# Patient Record
Sex: Male | Born: 1995 | Race: White | Hispanic: No | Marital: Single | Smoking: Never smoker
Health system: Southern US, Community
[De-identification: ages and names within clinical notes are randomized; demographics above are authoritative.]

## PROBLEM LIST (undated history)

## (undated) DIAGNOSIS — F988 Other specified behavioral and emotional disorders with onset usually occurring in childhood and adolescence: Secondary | ICD-10-CM

## (undated) HISTORY — PX: OTHER SURGICAL HISTORY: SHX169

## (undated) HISTORY — DX: Other specified behavioral and emotional disorders with onset usually occurring in childhood and adolescence: F98.8

---
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
## 2015-02-02 ENCOUNTER — Encounter: Payer: Self-pay | Admitting: Family Medicine

## 2015-02-02 ENCOUNTER — Ambulatory Visit (INDEPENDENT_AMBULATORY_CARE_PROVIDER_SITE_OTHER): Payer: 59 | Admitting: Family Medicine

## 2015-02-02 VITALS — BP 122/70 | Temp 97.6°F | Ht 71.0 in | Wt 294.0 lb

## 2015-02-02 DIAGNOSIS — B079 Viral wart, unspecified: Secondary | ICD-10-CM

## 2015-02-02 MED ORDER — SALICYLIC ACID 17 % EX SOLN: Freq: Every day | CUTANEOUS | Status: AC

## 2015-02-02 NOTE — Progress Notes (Signed)
   Subjective:    Patient ID: Gerald Roman, male    DOB: 1996/09/15, 19 y.o.   MRN: [id number]  HPIGrowth on leg leg. Came up about 3 months ago. Painful to the touch. Has tried Dr. Margart SicklesScholl's topical without success. No warts elsewhere  Working at a [employer], learning tht business  Overall healthy, added some weight  Just e cigarettes  Day numb [phone] Home  Review of Systems No rash no headache no chest pain no shortness of breath    Objective:   Physical Exam  Alert vital signs stable obesity present. Lungs clear heart regular rate and rhythm left anterior knee probable wart      Assessment & Plan:  Impression wart discussed #2 obesity discussed plan occlusal daily at bedtime. Dermatology referral. Maceo ProWSL

## 2015-02-25 ENCOUNTER — Encounter: Payer: Self-pay | Admitting: Family Medicine

## 2017-01-31 ENCOUNTER — Ambulatory Visit (INDEPENDENT_AMBULATORY_CARE_PROVIDER_SITE_OTHER): Payer: BLUE CROSS/BLUE SHIELD | Admitting: Family Medicine

## 2017-01-31 ENCOUNTER — Encounter: Payer: Self-pay | Admitting: Family Medicine

## 2017-01-31 VITALS — BP 136/84 | Temp 99.2°F | Ht 71.0 in | Wt 279.8 lb

## 2017-01-31 DIAGNOSIS — J329 Chronic sinusitis, unspecified: Secondary | ICD-10-CM | POA: Diagnosis not present

## 2017-01-31 MED ORDER — BENZONATATE 100 MG PO CAPS
100.0000 mg | ORAL_CAPSULE | Freq: Four times a day (QID) | ORAL | 0 refills | Status: AC | PRN
Start: 2017-01-31 — End: 2018-01-31

## 2017-01-31 MED ORDER — AMOXICILLIN-POT CLAVULANATE 875-125 MG PO TABS: 1.0000 | ORAL_TABLET | Freq: Two times a day (BID) | ORAL | 0 refills | Status: AC

## 2017-01-31 NOTE — Progress Notes (Signed)
   Subjective:    Patient ID: Gerald Roman, male    DOB: 09-15-1996, 21 y.o.   MRN: [id number]  Cough  This is a new problem. The current episode started in the past 7 days. Associated symptoms include a fever, headaches and nasal congestion. Associated symptoms comments: Vomiting and diarrhea. He has tried OTC cough suppressant for the symptoms.   Pretty bad headache. Muscle and joint ahes  Diarrhea at times  vom some pos nausea  Quit smoking, use s a vape    Tired all the time  Takes two aleve seven hrs apart  Started few days ago   Hit hard by Sunday      Review of Systems  Constitutional: Positive for fever.  Respiratory: Positive for cough.   Neurological: Positive for headaches.       Objective:   Physical Exam Alert, mild malaise. Hydration good Vitals stable. frontal/ maxillary tenderness evident positive nasal congestion. pharynx normal neck supple  lungs clear/no crackles or wheezes. heart regular in rhythm        Assessment & Plan:  Impression rhinosinusitis likely post viral, discussed with patient. plan antibiotics prescribed. Questions answered. Symptomatic care discussed. warning signs discussed. WSL

## 2017-09-27 ENCOUNTER — Ambulatory Visit: Payer: BLUE CROSS/BLUE SHIELD | Admitting: Family Medicine

## 2020-01-22 ENCOUNTER — Encounter: Payer: Self-pay | Admitting: Family Medicine

## 2020-01-23 ENCOUNTER — Encounter: Payer: Self-pay | Admitting: Family Medicine

## 2024-07-24 ENCOUNTER — Telehealth: Payer: Self-pay | Admitting: Urology

## 2024-07-24 ENCOUNTER — Ambulatory Visit (INDEPENDENT_AMBULATORY_CARE_PROVIDER_SITE_OTHER): Payer: Self-pay | Admitting: Urology

## 2024-07-24 ENCOUNTER — Encounter: Payer: Self-pay | Admitting: Urology

## 2024-07-24 VITALS — BP 143/97 | HR 97

## 2024-07-24 DIAGNOSIS — Z3141 Encounter for fertility testing: Secondary | ICD-10-CM

## 2024-07-24 NOTE — Patient Instructions (Addendum)
 Please contact [employer] at [phone] to schedule semen analysis. You will be required to bring the lab order and the specimen cups provided by our office to [address] [place] [address], use entrance [address].    Semen Analysis Test Why am I having this test? A semen analysis test is done to check certain aspects of the health of a male's reproductive organs (testes) and the hormone system that plays a role in semen production. Semen is a white or gray secretion that is released from the penis during the final phase of orgasm (ejaculation). It is made up of liquids and nutrients from the prostate gland, seminal vesicles, and other glands. It also contains sperm cells from the testes. A single sperm cell contains one complete set of a man's genetic coding (chromosomes). You may have this test as a part of infertility testing, which is done to help find out reasons for the inability to get a male pregnant (get her to conceive) after a year of having sex regularly without using birth control. The test may also be done to determine whether a previously performed vasectomy was successful. A vasectomy is a procedure done to make a male permanently infertile by tying the tube that collects the sperm from the testicle (the vas deferens). A vasectomy blocks the sperm from going through the vas deferens and penis so that the sperm will not go into the vagina during sex. What is being tested? If the test is done as part of infertility testing, sperm count along with the shape (morphology), size, and movement (motility) of sperm cells will be included in the analysis. This testing may also include assessing a sperm cell's ability to penetrate an egg (fertilize) as well as the formation of genetic material (DNA). If the test is done to check the success of a vasectomy, the sample will be checked for the presence of sperm. What kind of sample is taken? A semen sample is required for this test. A semen  sample will be collected by ejaculation into a germ-free (sterile) glass or plastic container provided by the lab. This can be done at home, in your health care provider's office, or in the lab. How do I collect samples at home?  If the sample will be collected at home, follow your health care provider's instructions about how to collect the sample. The sample should be delivered to the lab within 1 hour after collection. It should also be protected from extreme heat or cold. How do I prepare for this test? For infertility testing: Avoid sexual activity for 2-3 days before the semen sample collection. However, do not avoid ejaculation for a prolonged period because this can alter the motility of sperm cells. For vasectomy success testing: Make sure that you ejaculate one or two times before the day of semen sample collection. This will clear the vas deferens of any sperm that were present before the vasectomy was performed. Tell a health care provider about: All medicines you are taking, including vitamins, herbs, eye drops, creams, and over-the-counter medicines. Any surgeries, procedures, or medical treatments you have had. Any medical conditions you have. Any injuries you have had. How are the results reported? Your test results will be reported as values. Your health care provider will compare your results to normal values that were established after testing a large group of people (reference values). Reference values may vary among labs and hospitals. For this test, common reference values are: Volume: 2-5 mL. Liquefaction time:  20-30 minutes after collection. Appearance: white or gray in color; opaque. Sperm density or concentration (sperm/mL): 15 million or greater. Total sperm count: greater than 39 million. Vitality: greater than or equal to 58% live. pH: 7.2-8. Total sperm motility: 40% or greater. Sperm morphology: 4% or greater. What do the results mean? Low sperm count,  abnormal motility, or abnormal morphology of sperm cells may all cause problems with male fertility. Abnormal results can also be caused by: Certain infectious diseases, such as inflammation of a testis (orchitis) from mumps. Receiving certain kinds of medicines, such as chemotherapy. Having testicles that did not develop normally in childhood. When the semen analysis test is done to check the success of a vasectomy, the presence of sperm may mean that the surgery was not successful. Your health care provider may suggest a repeat of this test. Talk with your health care provider about what your results mean. Questions to ask your health care provider Ask your health care provider, or the department that is doing the test: When will my results be ready? How will I get my results? What are my treatment options? What other tests do I need? What are my next steps? Summary A semen analysis test is done to check certain aspects of the health of a male's reproductive organs (testes) and the hormone system that plays a role in semen production. You may have this test as a part of infertility testing, which is done to help find out reasons for the inability to get a male pregnant. The test may also be done to determine whether a previously performed vasectomy was successful. A semen sample will be collected by ejaculation into a sterile glass or plastic container. Follow instructions about how to collect the sample, and make sure you deliver the sample to the lab within 1 hour of obtaining it. Talk with your health care provider about what your results mean. This information is not intended to replace advice given to you by your health care provider. Make sure you discuss any questions you have with your health care provider. Document Revised: 08/11/2021 Document Reviewed: 08/11/2021 Elsevier Patient Education  2024 [employer].

## 2024-07-24 NOTE — Progress Notes (Unsigned)
 07/24/2024 2:52 PM   Gerald Roman 13-Feb-1996 [id number]  Referring provider: No referring provider defined for this encounter.  infertility   HPI: Mr Liller is a 28yo here for evaluation of infertility. Him and his wife have been trying to conceive for 2 years. He has 2 brothers and they both have children. He is an Adult nurse. Very rare marijuana use. Does not use hot tubs. No scrotal trauma or surgeries. No significant LUTS. No issues getting and maintaining an erection.     PMH: Past Medical History:  Diagnosis Date   ADD (attention deficit disorder)    on adderall    Surgical History: Past Surgical History:  Procedure Laterality Date   tracheotomy     28 yo    Home Medications:  Allergies as of 07/24/2024       Reactions   Sulfa Antibiotics         Medication List        Accurate as of July 24, 2024  2:52 PM. If you have any questions, ask your nurse or doctor.          salicylic acid -lactic acid 17 % external solution Apply topically daily.        Allergies:  Allergies  Allergen Reactions   Sulfa Antibiotics     Family History: Family History  Problem Relation Age of Onset   Diabetes Father    Diabetes Paternal Grandfather     Social History:  reports that he has never smoked. He has never used smokeless tobacco. He reports that he does not drink alcohol and does not use drugs.  ROS: All other review of systems were reviewed and are negative except what is noted above in HPI  Physical Exam: BP (!) 143/97   Pulse 97   Constitutional:  Alert and oriented, No acute distress. HEENT: [place] AT, moist mucus membranes.  Trachea midline, no masses. Cardiovascular: No clubbing, cyanosis, or edema. Respiratory: Normal respiratory effort, no increased work of breathing. GI: Abdomen is soft, nontender, nondistended, no abdominal masses GU: No CVA tenderness. Circumcised phallus. No masses/lesions on penis, testis, scrotum. Bilateral vas  deferens palpable  Lymph: No cervical or inguinal lymphadenopathy. Skin: No rashes, bruises or suspicious lesions. Neurologic: Grossly intact, no focal deficits, moving all 4 extremities. Psychiatric: Normal mood and affect.  Laboratory Data: Lab Results  Component Value Date   WBC 10.2 06/09/2008   HGB 13.4 06/09/2008   HCT 37.6 06/09/2008   MCV 86.7 06/09/2008   PLT 274 06/09/2008    Lab Results  Component Value Date   CREATININE 0.76 06/09/2008    No results found for: PSA  No results found for: TESTOSTERONE  No results found for: HGBA1C  Urinalysis No results found for: COLORURINE, APPEARANCEUR, LABSPEC, PHURINE, GLUCOSEU, HGBUR, BILIRUBINUR, KETONESUR, PROTEINUR, UROBILINOGEN, NITRITE, LEUKOCYTESUR  No results found for: LABMICR, WBCUA, RBCUA, LABEPIT, MUCUS, BACTERIA  Pertinent Imaging:  No results found for this or any previous visit.  No results found for this or any previous visit.  No results found for this or any previous visit.  No results found for this or any previous visit.  No results found for this or any previous visit.  No results found for this or any previous visit.  No results found for this or any previous visit.  No results found for this or any previous visit.   Assessment & Plan:    1. Fertility testing (Primary) Semen samples, 2 weeks apart, will call with results.  We will proceed with fertility workup based on semen samples   No follow-ups on file.  Belvie Clara, MD  [facility] [place]

## 2024-07-24 NOTE — Telephone Encounter (Signed)
 Patient wife called wants to know what is going on why wasn't a fertility test done when he paid $150 ? They needed fertility test done before end of the month.
